# Patient Record
Sex: Female | Born: 1990 | Race: Black or African American | Hispanic: No | Marital: Single | Smoking: Never smoker
Health system: Southern US, Community
[De-identification: ages and names within clinical notes are randomized; demographics above are authoritative.]

## PROBLEM LIST (undated history)

## (undated) ENCOUNTER — Emergency Department (HOSPITAL_BASED_OUTPATIENT_CLINIC_OR_DEPARTMENT_OTHER): Admission: EM | Payer: Self-pay | Source: Home / Self Care

---

## 2017-10-09 ENCOUNTER — Emergency Department (HOSPITAL_BASED_OUTPATIENT_CLINIC_OR_DEPARTMENT_OTHER)
Admission: EM | Admit: 2017-10-09 | Discharge: 2017-10-09 | Disposition: A | Discharge status: Home / Self Care | Payer: Worker's Compensation | Attending: Emergency Medicine | Admitting: Emergency Medicine

## 2017-10-09 ENCOUNTER — Emergency Department (HOSPITAL_BASED_OUTPATIENT_CLINIC_OR_DEPARTMENT_OTHER): Payer: Worker's Compensation

## 2017-10-09 ENCOUNTER — Other Ambulatory Visit: Payer: Self-pay

## 2017-10-09 ENCOUNTER — Encounter (HOSPITAL_BASED_OUTPATIENT_CLINIC_OR_DEPARTMENT_OTHER): Payer: Self-pay | Admitting: *Deleted

## 2017-10-09 DIAGNOSIS — Y929 Unspecified place or not applicable: Secondary | ICD-10-CM | POA: Insufficient documentation

## 2017-10-09 DIAGNOSIS — Y9389 Activity, other specified: Secondary | ICD-10-CM | POA: Insufficient documentation

## 2017-10-09 DIAGNOSIS — X500XXA Overexertion from strenuous movement or load, initial encounter: Secondary | ICD-10-CM | POA: Insufficient documentation

## 2017-10-09 DIAGNOSIS — S86911A Strain of unspecified muscle(s) and tendon(s) at lower leg level, right leg, initial encounter: Secondary | ICD-10-CM | POA: Insufficient documentation

## 2017-10-09 DIAGNOSIS — S8991XA Unspecified injury of right lower leg, initial encounter: Secondary | ICD-10-CM | POA: Diagnosis present

## 2017-10-09 DIAGNOSIS — Y999 Unspecified external cause status: Secondary | ICD-10-CM | POA: Diagnosis not present

## 2017-10-09 MED ORDER — IBUPROFEN 400 MG PO TABS: 400.0000 mg | ORAL_TABLET | Freq: Four times a day (QID) | ORAL | 0 refills | Status: AC | PRN

## 2017-10-09 NOTE — ED Notes (Signed)
Pt standing at door, reports that she is 'getting impatient' and 'doesnt want to wait'.

## 2017-10-09 NOTE — ED Notes (Signed)
Pt reports she thinks she injured her right knee while doing PT at work Monday or Tuesday, she is not sure which day. But was told to have it checked out before completing her PT test. PT reports pain to right knee with certain movements. No swelling or weakness noted.

## 2017-10-09 NOTE — ED Provider Notes (Signed)
[facility] EMERGENCY DEPARTMENT Provider Note   CSN: [id number] Arrival date & time: 10/09/17  1308     History   Chief Complaint Chief Complaint  Patient presents with  . Knee Injury    HPI Christy Fields is a 26 y.o. female.  HPI   26 year old female presenting for evaluation of right knee pain.  Patient states she works for the [employer] office and having to do a lot of physical activities.  She has to participate in different types of drills.  She believes she may have injured her right knee doing [employer] several days prior.  She complains of sharp achy pain into the knee, moderate in severity, worse with movement and improves with rest.  She is here requesting for a note for limited activities as it aggravates her knee.  She denies any hip or ankle pain.  She is able to ambulate on it.  Aside from elevation of her knee no other specific treatment tried.  History reviewed. No pertinent past medical history.  There are no active problems to display for this patient.   History reviewed. No pertinent surgical history.  OB History    No data available       Home Medications    Prior to Admission medications   Not on File    Family History History reviewed. No pertinent family history.  Social History Social History   Tobacco Use  . Smoking status: Never Smoker  . Smokeless tobacco: Never Used  Substance Use Topics  . Alcohol use: No    Frequency: Never  . Drug use: No     Allergies   Patient has no known allergies.   Review of Systems Review of Systems  Constitutional: Negative for fever.  Musculoskeletal: Positive for arthralgias.  Skin: Negative for wound.  Neurological: Negative for numbness.     Physical Exam Updated Vital Signs BP 127/83 (BP Location: Left Arm)   Pulse 90   Temp 98.2 F (36.8 C) (Oral)   Resp 16   Ht 5\' 4"  (1.626 m)   Wt 65.8 kg (145 lb)   LMP 09/24/2017   SpO2 100%   BMI 24.89 kg/m   Physical Exam    Constitutional: She appears well-developed and well-nourished. No distress.  HENT:  Head: Atraumatic.  Eyes: Conjunctivae are normal.  Neck: Neck supple.  Musculoskeletal: She exhibits tenderness (Right knee: Mild tenderness noted to the anterior knee at the inferior aspect of the patella with normal knee flexion extension no joint laxity and no swelling noted.  No deformity noted.).  Right ankle and right hips are nontender with full range of motion.  Neurological: She is alert.  Skin: No rash noted.  Psychiatric: She has a normal mood and affect.  Nursing note and vitals reviewed.    ED Treatments / Results  Labs (all labs ordered are listed, but only abnormal results are displayed) Labs Reviewed - No data to display  EKG  EKG Interpretation None       Radiology Dg Knee Complete 4 Views Right  Result Date: 10/09/2017 CLINICAL DATA:  26 year old female with right knee pain after fall of the chin 1 week ago. EXAM: RIGHT KNEE - COMPLETE 4+ VIEW COMPARISON:  None. FINDINGS: No evidence of fracture, dislocation, or joint effusion. No evidence of arthropathy or other focal bone abnormality. Soft tissues are unremarkable. IMPRESSION: Negative. Electronically Signed   By: Sande BrothersSerena  Chacko M.D.   On: 10/09/2017 14:34    Procedures Procedures (including critical care  time)  Medications Ordered in ED Medications - No data to display   Initial Impression / Assessment and Plan / ED Course  I have reviewed the triage vital signs and the nursing notes.  Pertinent labs & imaging results that were available during my care of the patient were reviewed by me and considered in my medical decision making (see chart for details).     BP 127/83 (BP Location: Left Arm)   Pulse 90   Temp 98.2 F (36.8 C) (Oral)   Resp 16   Ht 5\' 4"  (1.626 m)   Wt 65.8 kg (145 lb)   LMP 09/24/2017   SpO2 100%   BMI 24.89 kg/m    Final Clinical Impressions(s) / ED Diagnoses   Final diagnoses:   Strain of right knee, initial encounter    New Prescriptions This SmartLink is deprecated. Use AVSMEDLIST instead to display the medication list for a patient.   Patient complaining of right knee pain from activity at work.  X-ray of her right knee without any acute concerning injury.  Her exam is unremarkable.  She specifically requesting for a note to have limited activities at work for the next several days, note provided.  Rice therapy discussed.   Fayrene Helper, PA-C 10/09/17 1514    Vanetta Mulders, MD 10/12/17 [phone]

## 2017-10-09 NOTE — ED Triage Notes (Signed)
Pt reports injury at work Tuesday.  Works at [employer] and injury occurred during [employer].

## 2018-06-22 IMAGING — DX DG KNEE COMPLETE 4+V*R*
4 series · 4 of 4 positions shown · non-contrast
Comparison: None.

CLINICAL DATA: 25-year-old female with right knee pain after fall
of the chin 1 week ago.

EXAM:
RIGHT KNEE - COMPLETE 4+ VIEW

[knee ap]
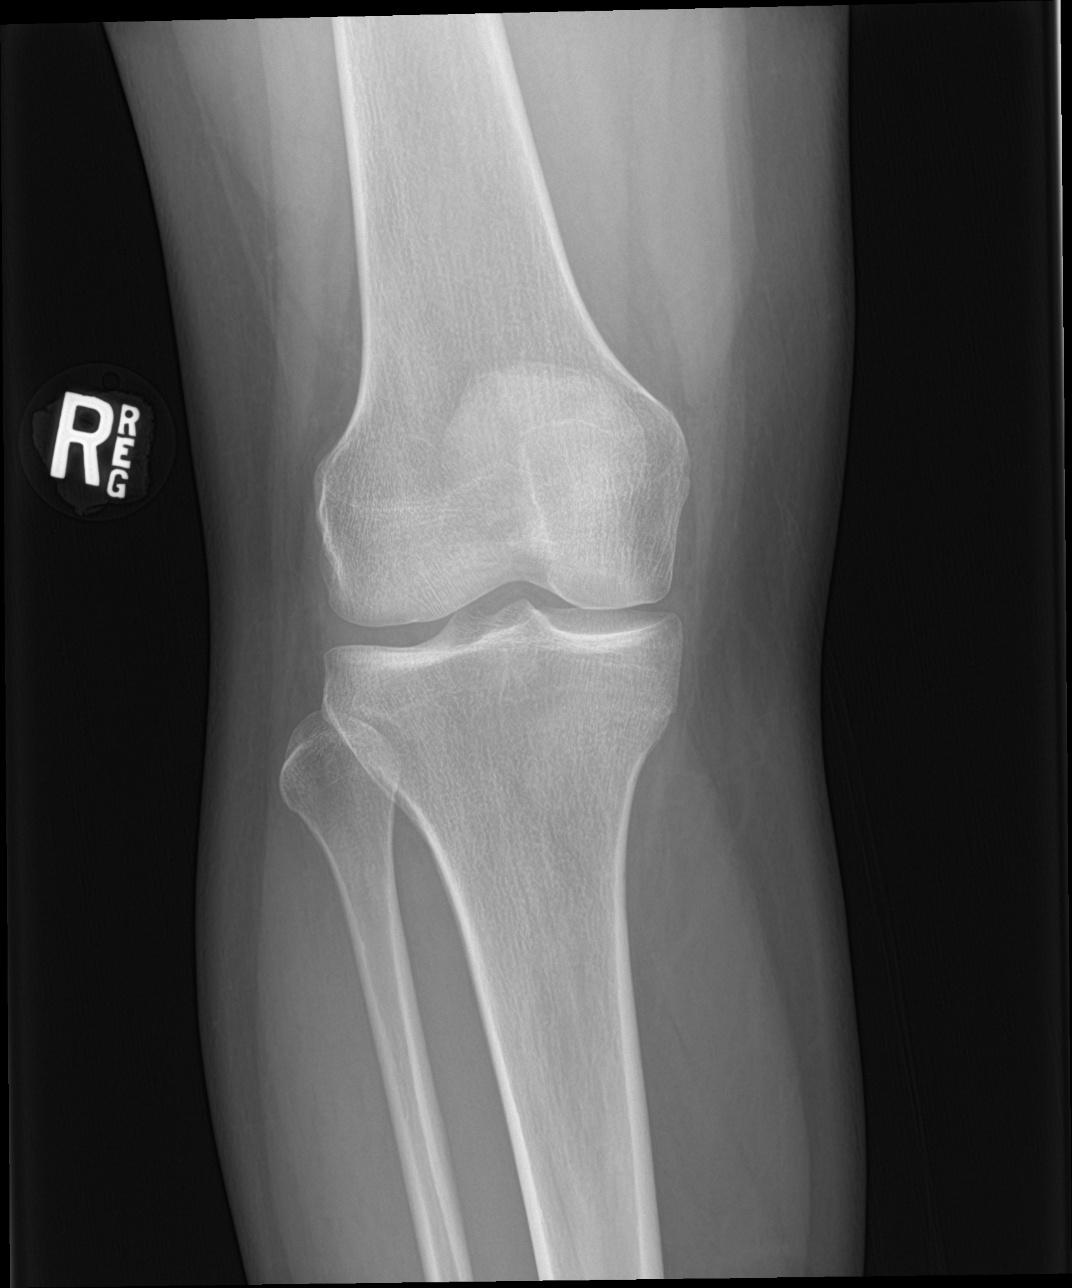

[tunnel]
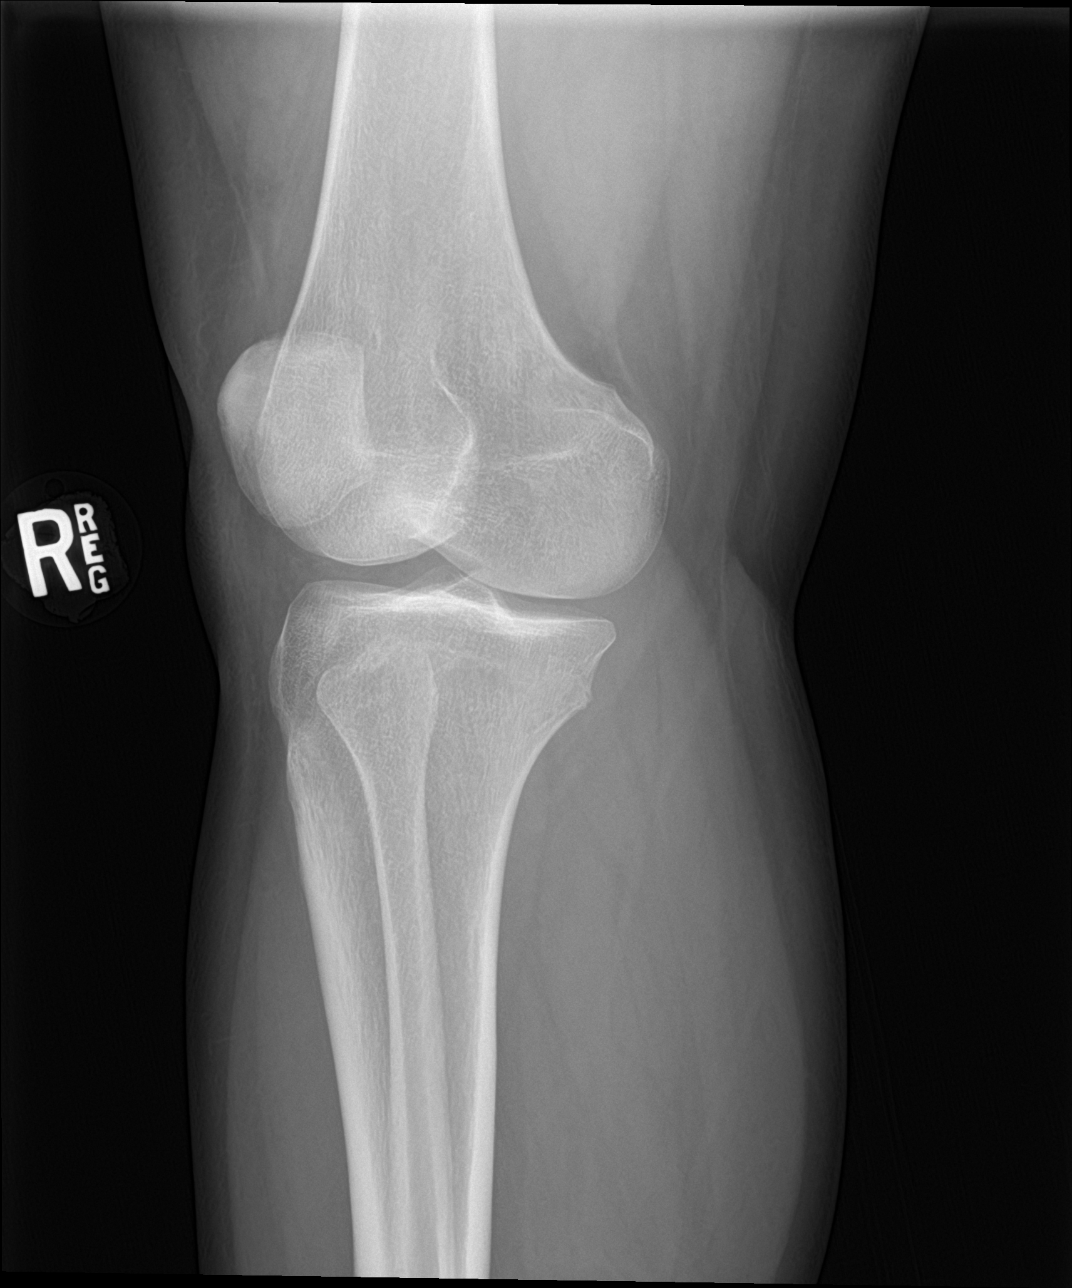

[knee lat]
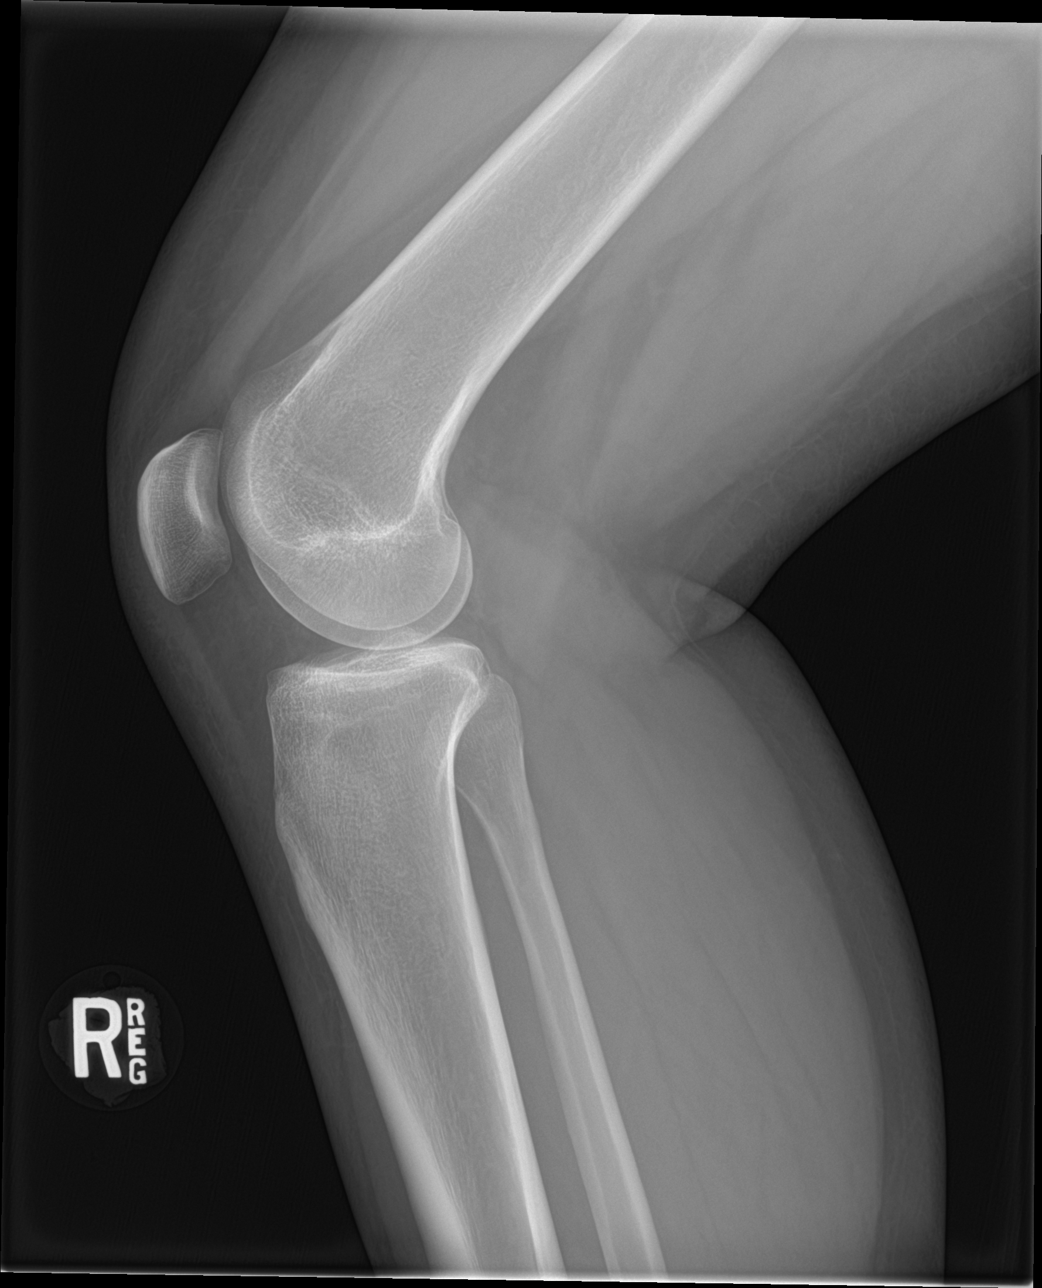

[knee obl]
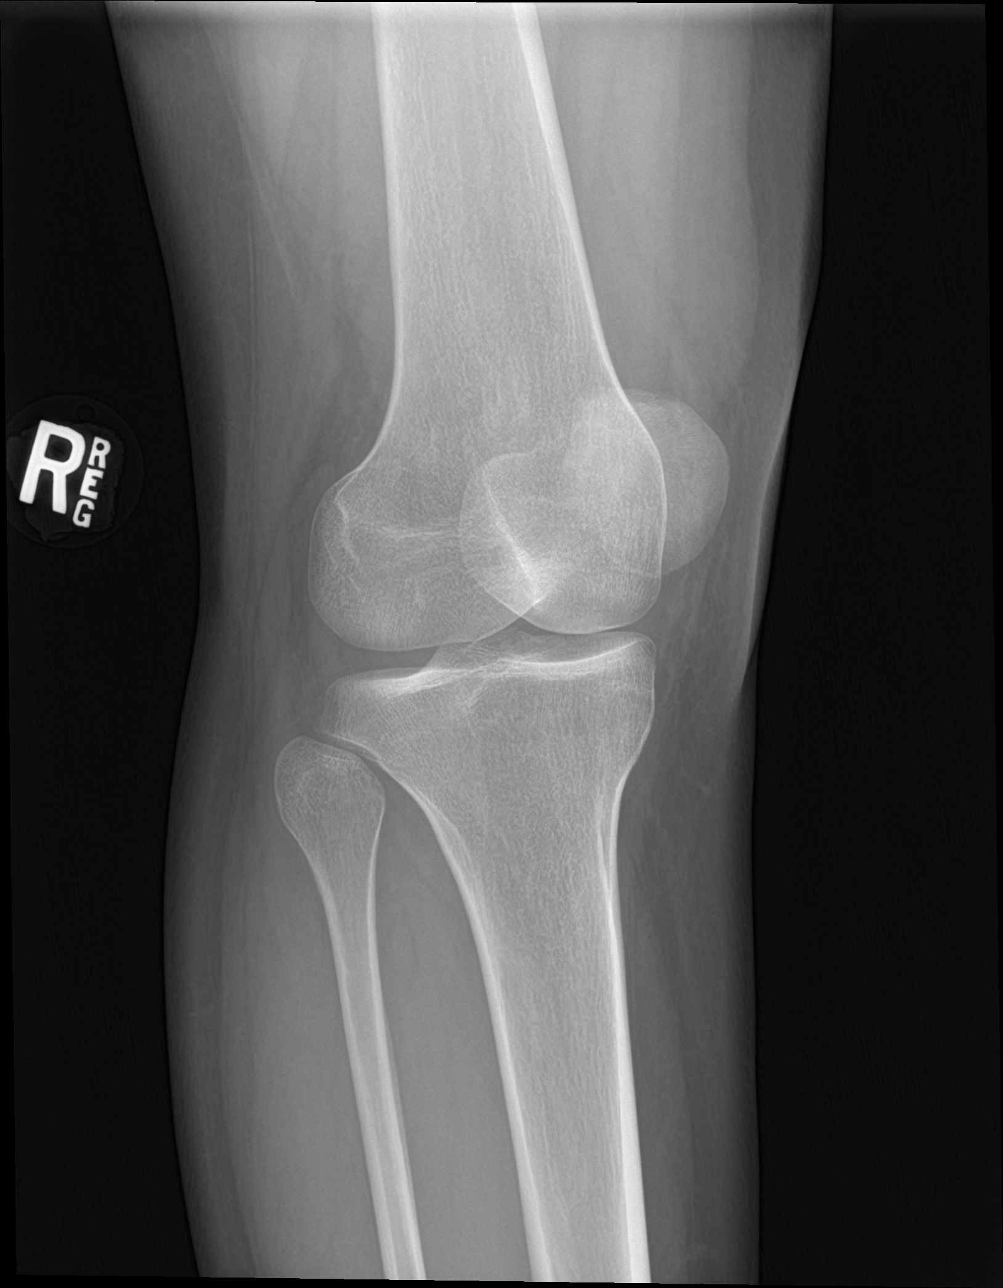

[4 of 4 positions shown; findings below may reference images not displayed]

FINDINGS: No evidence of fracture, dislocation, or joint effusion. No evidence
of arthropathy or other focal bone abnormality. Soft tissues are
unremarkable.
IMPRESSION: Negative.

## 2021-11-26 ENCOUNTER — Ambulatory Visit (HOSPITAL_COMMUNITY)
Admission: EM | Admit: 2021-11-26 | Discharge: 2021-11-26 | Disposition: A | Discharge status: Home / Self Care | Payer: 59 | Attending: Emergency Medicine | Admitting: Emergency Medicine

## 2021-11-26 ENCOUNTER — Encounter (HOSPITAL_COMMUNITY): Payer: Self-pay | Admitting: Emergency Medicine

## 2021-11-26 DIAGNOSIS — H109 Unspecified conjunctivitis: Secondary | ICD-10-CM | POA: Diagnosis not present

## 2021-11-26 DIAGNOSIS — N949 Unspecified condition associated with female genital organs and menstrual cycle: Secondary | ICD-10-CM | POA: Diagnosis not present

## 2021-11-26 LAB — HIV ANTIBODY (ROUTINE TESTING W REFLEX): HIV Screen 4th Generation wRfx: NONREACTIVE

## 2021-11-26 MED ORDER — FLUCONAZOLE 150 MG PO TABS: 150.0000 mg | ORAL_TABLET | Freq: Every day | ORAL | 0 refills | Status: AC

## 2021-11-26 MED ORDER — POLYMYXIN B-TRIMETHOPRIM 10000-0.1 UNIT/ML-% OP SOLN: 1.0000 [drp] | OPHTHALMIC | 0 refills | Status: AC

## 2021-11-26 NOTE — ED Triage Notes (Signed)
Pt presents with right eye irritation and drainage xs 2-3 days. States also concerned about vaginal irritation. States has hx of BV and yeast.

## 2021-11-26 NOTE — Discharge Instructions (Signed)
Today you being treated for bacterial conjunctivitis.   Place one drop of polytrim into the effected eye every 4 hours while awake for 7 days. If the other eye starts to have symptoms you may use medication in it as well. Do not allow tip of dropper to touch eye. May use cool compress for comfort and to remove discharge if present. Pat the eye, do not wipe.  Do not rub eyes, this may cause more irritation.  May use benadryl as needed to help if itching present.  Please avoid use of eye makeup until symptoms clear.  If symptoms persist after use of medication, please follow up at Urgent Care or with ophthalmologist (eye doctor)   You are being treated prophylactically for yeast infection  Take Diflucan once a day and if having symptoms in 72 hours (3 days ) may take second dose  Labs pending 2-3 days, you will be contacted if positive for any sti and treatment will be sent to the pharmacy, you will have to return to the clinic if positive for gonorrhea to receive treatment   Please refrain from having sex until labs results, if positive please refrain from having sex until treatment complete and symptoms resolve   If positive for HIV, Syphilis, Chlamydia  gonorrhea or trichomoniasis please notify partner or partners so they may tested as well  Moving forward, it is recommended you use some form of protection against the transmission of sti infections  such as condoms or dental dams with each sexual encounter

## 2021-11-26 NOTE — ED Provider Notes (Signed)
[facility]    CSN: [id number] Arrival date & time: 11/26/21  1734      History   Chief Complaint Chief Complaint  Patient presents with   Eye Drainage    Right   Vaginal Itching    HPI Christy Fields is a 30 y.o. female.   Patient presents with right eye watering and redness for 4 days. noticed drainage this morning.  Denies itching, blurred vision, floaters, light sensitivity.  No involvement of the left eye at this time.  Has not attempted treatment of symptoms.  Does not wear contacts or glasses.   Patient endorses vaginal discomfort for 2 weeks.  Denies vaginal discharge, itching, odor, urinary frequency, urgency, hematuria, dysuria, abdominal pain or pressure or flank pain.  Has not attempted treatment of symptoms.  Endorses frequent yeast infections and BV.  Sexually active, 1 partner, no condom used.  Endorses last sexual encounter 1 month ago.   History reviewed. No pertinent past medical history.  There are no problems to display for this patient.   History reviewed. No pertinent surgical history.  OB History   No obstetric history on file.      Home Medications    Prior to Admission medications   Medication Sig Start Date End Date Taking? Authorizing Provider  ibuprofen (ADVIL,MOTRIN) 400 MG tablet Take 1 tablet (400 mg total) every 6 (six) hours as needed by mouth. 10/09/17   Fayrene Helper, PA-C    Family History History reviewed. No pertinent family history.  Social History Social History   Tobacco Use   Smoking status: Never   Smokeless tobacco: Never  Substance Use Topics   Alcohol use: No   Drug use: No     Allergies   Patient has no known allergies.   Review of Systems Review of Systems  Constitutional: Negative.   HENT: Negative.    Eyes:  Positive for pain, discharge, redness and itching. Negative for photophobia and visual disturbance.  Respiratory: Negative.    Genitourinary:  Positive for vaginal discharge.  Negative for decreased urine volume, difficulty urinating, dyspareunia, dysuria, enuresis, flank pain, frequency, genital sores, hematuria, menstrual problem, pelvic pain, urgency, vaginal bleeding and vaginal pain.  Skin: Negative.   Neurological: Negative.     Physical Exam Triage Vital Signs ED Triage Vitals  Enc Vitals Group     BP 11/26/21 1803 127/83     Pulse Rate 11/26/21 1803 87     Resp 11/26/21 1803 16     Temp 11/26/21 1803 98.5 F (36.9 C)     Temp Source 11/26/21 1803 Oral     SpO2 11/26/21 1803 98 %     Weight --      Height --      Head Circumference --      Peak Flow --      Pain Score 11/26/21 1802 0     Pain Loc --      Pain Edu? --      Excl. in GC? --    No data found.  Updated Vital Signs BP 127/83 (BP Location: Left Arm)    Pulse 87    Temp 98.5 F (36.9 C) (Oral)    Resp 16    LMP 11/15/2021    SpO2 98%   Visual Acuity Right Eye Distance:   Left Eye Distance:   Bilateral Distance:    Right Eye Near:   Left Eye Near:    Bilateral Near:     Physical Exam Constitutional:  Appearance: Normal appearance. She is normal weight.  HENT:     Head: Normocephalic.  Eyes:     Comments: Erythema present on the right conjunctiva, no drainage noted at this time no swelling noted, visual acuity intact, extraocular movements intact, no involvement of the left eye  Pulmonary:     Effort: Pulmonary effort is normal.  Genitourinary:    Comments: Deferred self collect vaginal swab Skin:    General: Skin is warm and dry.  Neurological:     Mental Status: She is alert and oriented to person, place, and time. Mental status is at baseline.  Psychiatric:        Mood and Affect: Mood normal.        Behavior: Behavior normal.     UC Treatments / Results  Labs (all labs ordered are listed, but only abnormal results are displayed) Labs Reviewed  CERVICOVAGINAL ANCILLARY ONLY    EKG   Radiology No results found.  Procedures Procedures (including  critical care time)  Medications Ordered in UC Medications - No data to display  Initial Impression / Assessment and Plan / UC Course  I have reviewed the triage vital signs and the nursing notes.  Pertinent labs & imaging results that were available during my care of the patient were reviewed by me and considered in my medical decision making (see chart for details).  Bacterial conjunctivitis of right Vaginal discomfort  1.  Polytrim 1 drop right eye every 4 hours for 7 days, recommended discontinuation of make-up and eyelashes until symptoms resolved, cool compresses for comfort.  Oral antihistamines recommended as needed for itching, urgent care or ophthalmology follow-up for persistent symptoms 2.  STI screening pending will treat per protocol, will treat prophylactically for yeast, Diflucan prescribed, advised abstinence until labs result, treatment is complete and symptoms have resolved, recommended condom use with all sexual encounters moving forward, urgent care follow-up as needed Final Clinical Impressions(s) / UC Diagnoses   Final diagnoses:  None   Discharge Instructions   None    ED Prescriptions   None    PDMP not reviewed this encounter.   Hans Eden, NP 11/26/21 1946

## 2021-11-27 LAB — CERVICOVAGINAL ANCILLARY ONLY
Bacterial Vaginitis (gardnerella): NEGATIVE
Candida Glabrata: NEGATIVE
Candida Vaginitis: POSITIVE — AB
Chlamydia: NEGATIVE
Comment: NEGATIVE
Comment: NEGATIVE
Comment: NEGATIVE
Comment: NEGATIVE
Comment: NEGATIVE
Comment: NORMAL
Neisseria Gonorrhea: NEGATIVE
Trichomonas: NEGATIVE

## 2021-11-27 LAB — RPR: RPR Ser Ql: NONREACTIVE

## 2022-01-14 ENCOUNTER — Ambulatory Visit (HOSPITAL_COMMUNITY)
Admission: EM | Admit: 2022-01-14 | Discharge: 2022-01-14 | Disposition: A | Discharge status: Home / Self Care | Payer: 59 | Attending: Family Medicine | Admitting: Family Medicine

## 2022-01-14 ENCOUNTER — Encounter (HOSPITAL_COMMUNITY): Payer: Self-pay

## 2022-01-14 DIAGNOSIS — Z113 Encounter for screening for infections with a predominantly sexual mode of transmission: Secondary | ICD-10-CM | POA: Insufficient documentation

## 2022-01-14 DIAGNOSIS — R52 Pain, unspecified: Secondary | ICD-10-CM | POA: Diagnosis present

## 2022-01-14 DIAGNOSIS — K219 Gastro-esophageal reflux disease without esophagitis: Secondary | ICD-10-CM | POA: Insufficient documentation

## 2022-01-14 DIAGNOSIS — H5789 Other specified disorders of eye and adnexa: Secondary | ICD-10-CM | POA: Insufficient documentation

## 2022-01-14 LAB — HIV ANTIBODY (ROUTINE TESTING W REFLEX): HIV Screen 4th Generation wRfx: NONREACTIVE

## 2022-01-14 NOTE — ED Provider Notes (Signed)
[facility]    CSN: [id number] Arrival date & time: 01/14/22  0920      History   Chief Complaint Chief Complaint  Patient presents with   STD Testing     HPI Christy Fields is a 31 y.o. female.   Patient is here for std screening.  No known exposures.  She has been having stomach pain off/on for the week.  Some headaches, no n/v.  No vaginal discharge noted.  No urinary symptoms.  She would like lab work as well.   Having right eye d/c x 1 month.  Does not look red.  Just watery d/c, she has to wipe it often.  She was treated for pink eye 1 month ago.   Having some gerd symptoms.  No otc meds taken.   History reviewed. No pertinent past medical history.  There are no problems to display for this patient.   History reviewed. No pertinent surgical history.  OB History   No obstetric history on file.      Home Medications    Prior to Admission medications   Medication Sig Start Date End Date Taking? Authorizing Provider  ibuprofen (ADVIL,MOTRIN) 400 MG tablet Take 1 tablet (400 mg total) every 6 (six) hours as needed by mouth. 10/09/17   Fayrene Helper, PA-C  trimethoprim-polymyxin b (POLYTRIM) ophthalmic solution Place 1 drop into the right eye every 4 (four) hours. 11/26/21   Valinda Hoar, NP    Family History History reviewed. No pertinent family history.  Social History Social History   Tobacco Use   Smoking status: Never   Smokeless tobacco: Never  Substance Use Topics   Alcohol use: No   Drug use: No     Allergies   Patient has no known allergies.   Review of Systems Review of Systems  Constitutional: Negative.   HENT: Negative.    Eyes:  Positive for discharge and redness. Negative for pain.  Respiratory: Negative.    Cardiovascular: Negative.   Gastrointestinal:  Positive for abdominal pain.  Genitourinary: Negative.     Physical Exam Triage Vital Signs ED Triage Vitals  Enc Vitals Group     BP 01/14/22 1012  128/87     Pulse Rate 01/14/22 1012 70     Resp 01/14/22 1012 18     Temp 01/14/22 1012 98.2 F (36.8 C)     Temp Source 01/14/22 1012 Oral     SpO2 01/14/22 1012 99 %     Weight --      Height --      Head Circumference --      Peak Flow --      Pain Score 01/14/22 1015 0     Pain Loc --      Pain Edu? --      Excl. in GC? --    No data found.  Updated Vital Signs BP 128/87 (BP Location: Left Arm)    Pulse 70    Temp 98.2 F (36.8 C) (Oral)    Resp 18    LMP 12/31/2021    SpO2 99%   Visual Acuity Right Eye Distance:   Left Eye Distance:   Bilateral Distance:    Right Eye Near:   Left Eye Near:    Bilateral Near:     Physical Exam Constitutional:      Appearance: Normal appearance.  HENT:     Head: Normocephalic and atraumatic.  Eyes:     General: Lids are normal.  Right eye: Foreign body present.     Conjunctiva/sclera:     Right eye: Right conjunctiva is not injected.     Left eye: Left conjunctiva is not injected.     Comments: Small eyelash in the right bottom eyelid;    Cardiovascular:     Rate and Rhythm: Normal rate and regular rhythm.  Pulmonary:     Effort: Pulmonary effort is normal.     Breath sounds: Normal breath sounds.  Abdominal:     Palpations: Abdomen is soft.     Tenderness: There is no abdominal tenderness.  Musculoskeletal:     Cervical back: Normal range of motion and neck supple.  Neurological:     Mental Status: She is alert.   Eyelash in the right lower eyelid was removed easily using a q-tip;  patient tolerated this well;   UC Treatments / Results  Labs (all labs ordered are listed, but only abnormal results are displayed) Labs Reviewed - No data to display  EKG   Radiology No results found.  Procedures Procedures (including critical care time)  Medications Ordered in UC Medications - No data to display  Initial Impression / Assessment and Plan / UC Course  I have reviewed the triage vital signs and the  nursing notes.  Pertinent labs & imaging results that were available during my care of the patient were reviewed by me and considered in my medical decision making (see chart for details).  Patient was here primarily for STI screening.  This was done today and called for any positive results.  Having right eye clear drainage x 1 month.  Eyelash present that was removed today.  If this is not helpful then I recommend otc eye drops, and to see an eye specialist.  She is having gerd symptoms.  Recommend otc prilosec x 2 weeks.  Follow up with her pcp if not improving.    Final Clinical Impressions(s) / UC Diagnoses   Final diagnoses:  Screening examination for STD (sexually transmitted disease)  Eye drainage  Gastroesophageal reflux disease without esophagitis  Body aches     Discharge Instructions      You were seen today for several issues.  We have completed your STI screening.  This will be resulted and will treat if anything is positive.  For your eye drainage, I recommend over the counter zaditor, or pataday.  If these are not helpful then please see an eye specialist.  For GERD, I recommend over the counter prilosec taken once/daily x 2 weeks.  If that does not help then please follow up with your primary care provider.     ED Prescriptions   None    PDMP not reviewed this encounter.   Jannifer Franklin, MD 01/14/22 1048

## 2022-01-14 NOTE — Discharge Instructions (Signed)
You were seen today for several issues.  We have completed your STI screening.  This will be resulted and will treat if anything is positive.  For your eye drainage, I recommend over the counter zaditor, or pataday.  If these are not helpful then please see an eye specialist.  For GERD, I recommend over the counter prilosec taken once/daily x 2 weeks.  If that does not help then please follow up with your primary care provider.

## 2022-01-14 NOTE — ED Triage Notes (Signed)
Pt presents for STD Testing with no known symptoms. 

## 2022-01-15 ENCOUNTER — Telehealth (HOSPITAL_COMMUNITY): Payer: Self-pay | Admitting: Emergency Medicine

## 2022-01-15 LAB — CERVICOVAGINAL ANCILLARY ONLY
Bacterial Vaginitis (gardnerella): NEGATIVE
Candida Glabrata: NEGATIVE
Candida Vaginitis: POSITIVE — AB
Chlamydia: NEGATIVE
Comment: NEGATIVE
Comment: NEGATIVE
Comment: NEGATIVE
Comment: NEGATIVE
Comment: NEGATIVE
Comment: NORMAL
Neisseria Gonorrhea: NEGATIVE
Trichomonas: NEGATIVE

## 2022-01-15 LAB — RPR: RPR Ser Ql: NONREACTIVE

## 2022-01-15 MED ORDER — FLUCONAZOLE 150 MG PO TABS: 150.0000 mg | ORAL_TABLET | Freq: Once | ORAL | 0 refills | Status: AC

## 2022-02-10 ENCOUNTER — Ambulatory Visit: Payer: Self-pay

## 2022-02-10 ENCOUNTER — Other Ambulatory Visit: Payer: Self-pay

## 2022-02-10 ENCOUNTER — Other Ambulatory Visit: Payer: Self-pay | Admitting: Family Medicine

## 2022-02-10 DIAGNOSIS — M25531 Pain in right wrist: Secondary | ICD-10-CM
